# Patient Record
Sex: Female | Born: 1990 | Race: White | Hispanic: No | Marital: Single | Smoking: Former smoker
Health system: Southern US, Community
[De-identification: ages and names within clinical notes are randomized; demographics above are authoritative.]

## PROBLEM LIST (undated history)

## (undated) DIAGNOSIS — T7840XA Allergy, unspecified, initial encounter: Secondary | ICD-10-CM

## (undated) HISTORY — PX: TYMPANOSTOMY TUBE PLACEMENT: SHX32

## (undated) HISTORY — DX: Allergy, unspecified, initial encounter: T78.40XA

---

## 2013-05-26 ENCOUNTER — Ambulatory Visit (INDEPENDENT_AMBULATORY_CARE_PROVIDER_SITE_OTHER): Payer: BC Managed Care – PPO | Admitting: Family Medicine

## 2013-05-26 ENCOUNTER — Ambulatory Visit: Payer: BC Managed Care – PPO

## 2013-05-26 VITALS — BP 134/72 | HR 71 | Temp 98.4°F | Resp 20 | Ht 65.0 in | Wt 204.0 lb

## 2013-05-26 DIAGNOSIS — R059 Cough, unspecified: Secondary | ICD-10-CM

## 2013-05-26 DIAGNOSIS — R05 Cough: Secondary | ICD-10-CM

## 2013-05-26 DIAGNOSIS — R062 Wheezing: Secondary | ICD-10-CM

## 2013-05-26 LAB — POCT CBC
Granulocyte percent: 69.4 %G (ref 37–80)
HCT, POC: 48.1 % — AB (ref 37.7–47.9)
Hemoglobin: 15.4 g/dL (ref 12.2–16.2)
Lymph, poc: 2.9 (ref 0.6–3.4)
MCH, POC: 29.5 pg (ref 27–31.2)
MCHC: 32 g/dL (ref 31.8–35.4)
MCV: 92.2 fL (ref 80–97)
MID (cbc): 0.7 (ref 0–0.9)
MPV: 11.4 fL (ref 0–99.8)
POC Granulocyte: 8.2 — AB (ref 2–6.9)
POC LYMPH PERCENT: 24.9 %L (ref 10–50)
POC MID %: 5.7 %M (ref 0–12)
Platelet Count, POC: 316 10*3/uL (ref 142–424)
RBC: 5.22 M/uL (ref 4.04–5.48)
RDW, POC: 14.2 %
WBC: 11.8 10*3/uL — AB (ref 4.6–10.2)

## 2013-05-26 MED ORDER — ALBUTEROL SULFATE (2.5 MG/3ML) 0.083% IN NEBU
2.5000 mg | INHALATION_SOLUTION | Freq: Once | RESPIRATORY_TRACT | Status: AC
Start: 2013-05-26 — End: 2013-05-26
  Administered 2013-05-26: 2.5 mg via RESPIRATORY_TRACT

## 2013-05-26 MED ORDER — IPRATROPIUM BROMIDE 0.02 % IN SOLN
0.5000 mg | Freq: Once | RESPIRATORY_TRACT | Status: AC
  Administered 2013-05-26: 0.5 mg via RESPIRATORY_TRACT

## 2013-05-26 MED ORDER — ALBUTEROL SULFATE HFA 108 (90 BASE) MCG/ACT IN AERS: 2.0000 | INHALATION_SPRAY | RESPIRATORY_TRACT | Status: DC | PRN

## 2013-05-26 MED ORDER — METHYLPREDNISOLONE (PAK) 4 MG PO TABS: ORAL_TABLET | ORAL | Status: DC

## 2013-05-26 MED ORDER — AZITHROMYCIN 250 MG PO TABS: ORAL_TABLET | ORAL | Status: DC

## 2013-05-26 NOTE — Progress Notes (Signed)
  Subjective:    Patient ID: Amy Winters, female    DOB: 1991/03/17, 22 y.o.   MRN: [id number]  HPI 22 year old female presents for evaluation of cough x 3 weeks. States cough is productive of yellow/green sputum.  Denies fever, chills, nasal congestion, sore throat, otalgia, nausea, vomiting, dizziness, hemoptysis, or headache. Does have SOB and complains of "tight" feeling in chest.  She has not taken any OTC medications for this but does have hx of seasonal allergies for which she takes loratadine fairly regularly.  No hx of asthma.  Patient is otherwise healthy with no other concerns today.     Review of Systems  Constitutional: Negative for fever and chills.  HENT: Negative for congestion, postnasal drip, rhinorrhea, sinus pressure and sore throat.   Respiratory: Positive for cough, chest tightness and wheezing.   Cardiovascular: Negative for chest pain.  Neurological: Negative for dizziness and headaches.       Objective:   Physical Exam  Constitutional: She is oriented to person, place, and time. She appears well-developed and well-nourished.  HENT:  Head: Normocephalic and atraumatic.  Right Ear: Hearing, tympanic membrane, external ear and ear canal normal.  Left Ear: Hearing, tympanic membrane, external ear and ear canal normal.  Mouth/Throat: Uvula is midline, oropharynx is clear and moist and mucous membranes are normal. No oropharyngeal exudate.  Eyes: Conjunctivae are normal.  Neck: Normal range of motion. Neck supple.  Cardiovascular: Normal rate, regular rhythm and normal heart sounds.   Pulmonary/Chest: Effort normal. She has wheezes. She has no rales.  Lymphadenopathy:    She has no cervical adenopathy.  Neurological: She is alert and oriented to person, place, and time.  Psychiatric: She has a normal mood and affect. Her behavior is normal. Judgment and thought content normal.     Pre-nebulizer peak flow 250. Post-nebulizer peak flow 350. Patient reports  significant improvement in symptoms after nebulizer [facility] reading (PRIMARY) by  Dr. Katrinka Blazing as no acute infiltrate or consolidation.      Assessment & Plan:  Cough - Plan: DG Chest 2 View, POCT CBC  Wheezing - Plan: albuterol (PROVENTIL) (2.5 MG/3ML) 0.083% nebulizer solution 2.5 mg, ipratropium (ATROVENT) nebulizer solution 0.5 mg  Start Zpack tonight and medrol dose pack tomorrow morning Albuterol inhaler q4-6hours prn wheezing.  Recommend mucinex OTC as directed Follow up if symptoms worsen or fail to improve.

## 2014-02-17 ENCOUNTER — Ambulatory Visit (INDEPENDENT_AMBULATORY_CARE_PROVIDER_SITE_OTHER): Payer: BC Managed Care – PPO | Admitting: Family Medicine

## 2014-02-17 VITALS — BP 118/68 | HR 62 | Temp 97.6°F | Resp 16 | Ht 64.0 in | Wt 213.0 lb

## 2014-02-17 DIAGNOSIS — J0110 Acute frontal sinusitis, unspecified: Secondary | ICD-10-CM

## 2014-02-17 DIAGNOSIS — R059 Cough, unspecified: Secondary | ICD-10-CM

## 2014-02-17 DIAGNOSIS — R05 Cough: Secondary | ICD-10-CM

## 2014-02-17 DIAGNOSIS — J0111 Acute recurrent frontal sinusitis: Secondary | ICD-10-CM

## 2014-02-17 MED ORDER — AZITHROMYCIN 250 MG PO TABS: ORAL_TABLET | ORAL | Status: DC

## 2014-02-17 NOTE — Progress Notes (Signed)
[facility] [address], [place] [id number] [phone]  Date:  02/17/2014   Name:  Amy Winters   DOB:  07/03/1991   MRN:  [id number]  PCP:  No PCP Per Patient    Chief Complaint: Cough and Ear Fullness   History of Present Illness:  Amy Winters is a 23 y.o. very pleasant female patient who presents with the following:  She is here today with illness for about one week. She has noted congestion in her sinuses, right ear fullness.  Notes that she tends to get ear wax and wonders if this could be the case now.   She has noted a mild cough- "tickly throat." She has not noted a fever, she has noted some chills and aches as well a few days ago.  She slept a lot on Saturday.  No GI symptoms.   [place] is not SA so there is no risk of pregnancy and she is generally irregulr  There are no active problems to display for this patient.   Past Medical History  Diagnosis Date  . Allergy     History reviewed. No pertinent past surgical history.  History  Substance Use Topics  . Smoking status: Former Games developermoker  . Smokeless tobacco: Not on file  . Alcohol Use: Yes    Family History  Problem Relation Age of Onset  . Diabetes Paternal Grandmother   . Stroke Paternal Grandmother     Allergies  Allergen Reactions  . Amoxicillin Nausea And Vomiting    Medication list has been reviewed and updated.  Current Outpatient Prescriptions on File Prior to Visit  Medication Sig Dispense Refill  . Loratadine-Pseudoephedrine (PX ALLERGY RELIEF D, LORATID, PO) Take by mouth.      Marland Kitchen. albuterol (PROVENTIL HFA;VENTOLIN HFA) 108 (90 BASE) MCG/ACT inhaler Inhale 2 puffs into the lungs every 4 (four) hours as needed for wheezing or shortness of breath (cough, shortness of breath or wheezing.).  1 Inhaler  0  . azithromycin (ZITHROMAX) 250 MG tablet Take 2 tabs PO x 1 dose, then 1 tab PO QD x 4 days  6 tablet  0  . methylPREDNIsolone (MEDROL DOSPACK) 4 MG  tablet follow package directions  21 tablet  0   No current facility-administered medications on file prior to visit.    Review of Systems:  As per HPI- otherwise negative.   Physical Examination: Filed Vitals:   02/17/14 1218  BP: 118/68  Pulse: 62  Temp: 97.6 F (36.4 C)  Resp: 16   Filed Vitals:   02/17/14 1218  Height: 5\' 4"  (1.626 m)  Weight: 213 lb (96.616 kg)   Body mass index is 36.54 kg/(m^2). Ideal Body Weight: Weight in (lb) to have BMI = 25: 145.3  GEN: WDWN, NAD, Non-toxic, A & O x 3, overweight, looks well HEENT: Atraumatic, Normocephalic. Neck supple. No masses, No LAD.  Bilateral TM wnl, oropharynx normal.  PEERL,EOMI.  Right ear obscured by wax.  This was removed with curette and warm water irrigation- then normal exam Ears and Nose: No external deformity. CV: RRR, No M/G/R. No JVD. No thrill. No extra heart sounds. PULM: CTA B, no wheezes, crackles, rhonchi. No retractions. No resp. distress. No accessory muscle use EXTR: No c/c/e NEURO Normal gait.  PSYCH: Normally interactive. Conversant. Not depressed or anxious appearing.  Calm demeanor.    Assessment and Plan: Acute recurrent frontal sinusitis - Plan: azithromycin (ZITHROMAX) 250 MG tablet  Cough -  Plan: azithromycin (ZITHROMAX) 250 MG tablet  Treat for sinusitis with azithromycin. She will continue to use oTC medications as needed, and will let me know if not better soon   Signed Abbe Amsterdam, MD

## 2014-02-17 NOTE — Patient Instructions (Signed)
Use the azithromycin as directed.  Let me know if you are not getting better in the next few days Sooner if worse.

## 2014-02-24 ENCOUNTER — Ambulatory Visit (INDEPENDENT_AMBULATORY_CARE_PROVIDER_SITE_OTHER): Payer: BC Managed Care – PPO | Admitting: Family Medicine

## 2014-02-24 VITALS — BP 112/76 | HR 69 | Temp 98.0°F | Resp 12 | Ht 64.5 in | Wt 213.0 lb

## 2014-02-24 DIAGNOSIS — J0181 Other acute recurrent sinusitis: Secondary | ICD-10-CM

## 2014-02-24 DIAGNOSIS — J019 Acute sinusitis, unspecified: Secondary | ICD-10-CM

## 2014-02-24 MED ORDER — FLUTICASONE PROPIONATE 50 MCG/ACT NA SUSP: NASAL | Status: AC

## 2014-02-24 MED ORDER — CEFDINIR 300 MG PO CAPS: 600.0000 mg | ORAL_CAPSULE | Freq: Every day | ORAL | Status: AC

## 2014-02-24 NOTE — Progress Notes (Signed)
Subjective: 23 year old lady who was treated last week for sinusitis. She is going to [place] this weekend and didn't think she was clearing up completely enough. She has done better, but still has a lot of postnasal drainage and some otalgia. No fever. Does not smoke.  Objective: TMs are normal. Throat has 2 erythematous streaks going down either side. Neck supple without significant nodes. No major tenderness of the sinuses. Chest clear. Heart regular without murmurs.  Assessment: Recurrent or incompletely treated sinusitis  Plan: Omnicef 300 twice a day Fluticasone spray Return when necessary

## 2015-02-20 IMAGING — CR DG CHEST 2V
2 series · 2 of 2 positions shown · non-contrast
Comparison: None.

CLINICAL DATA: Cough

EXAM:
CHEST  2 VIEW

[PA]
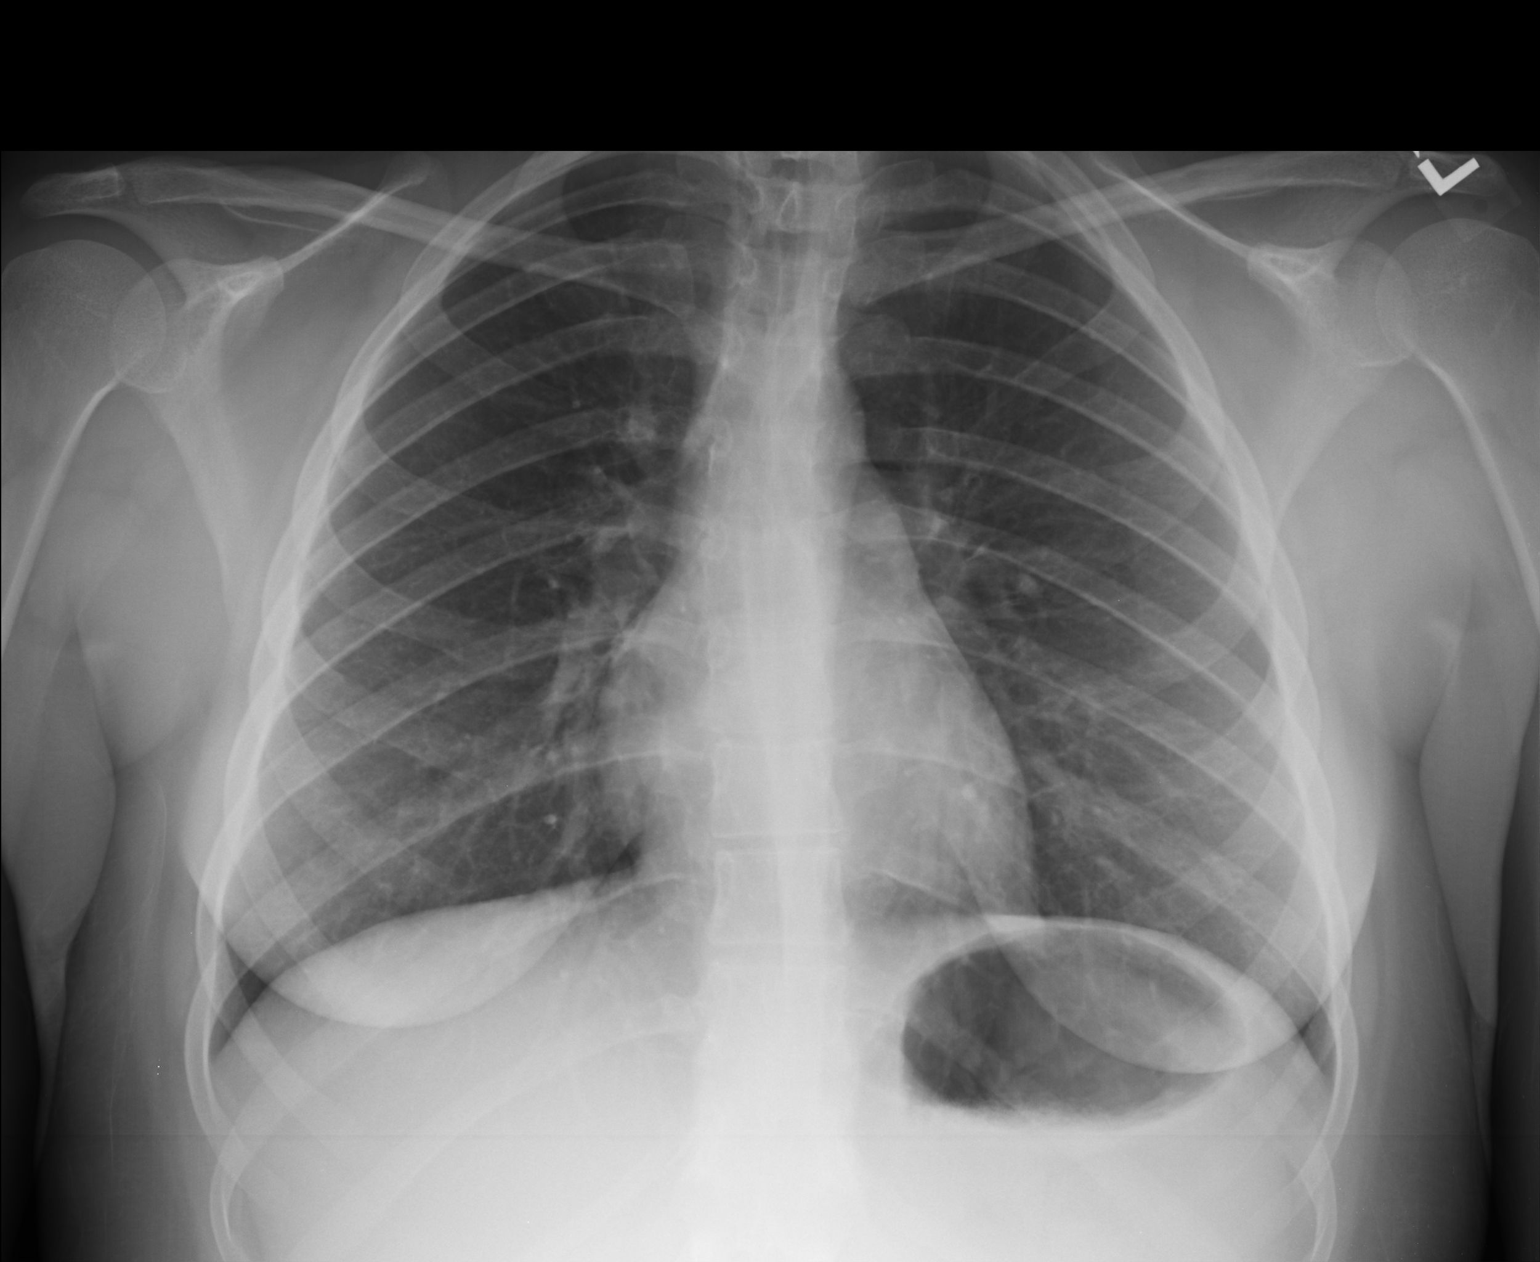

[lateral]
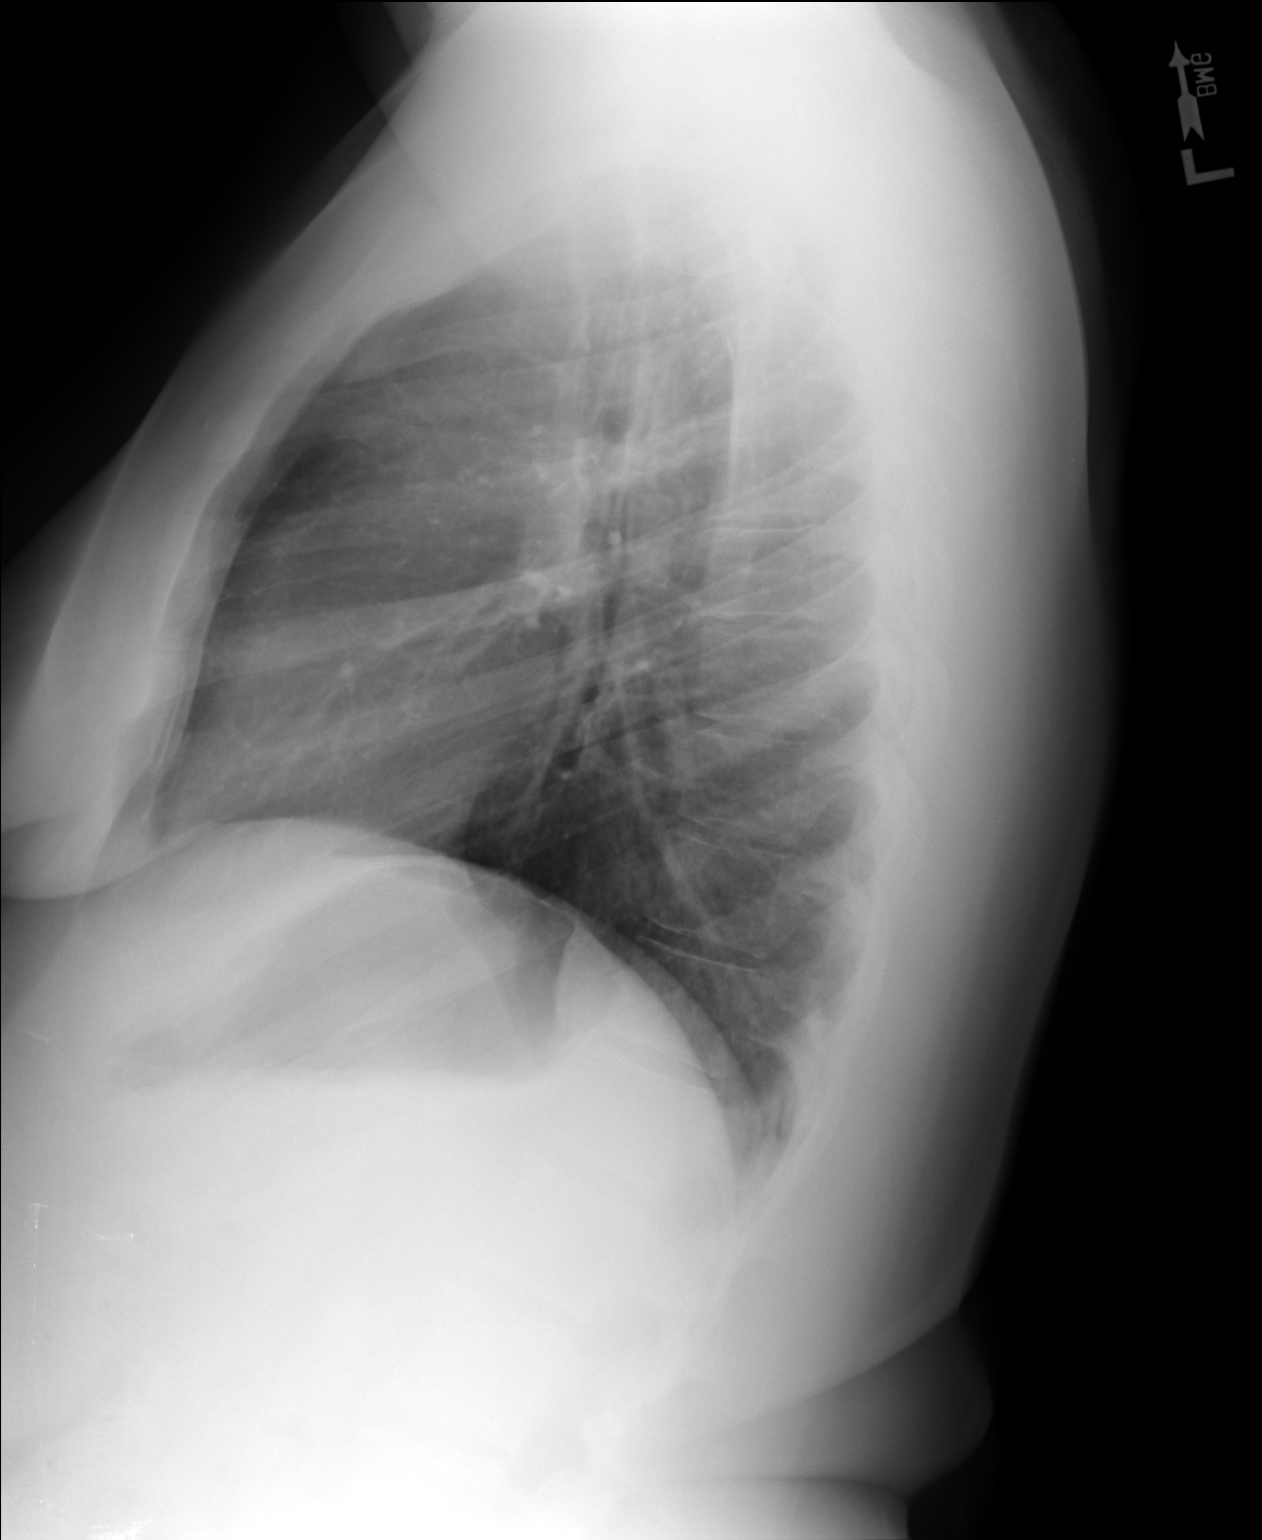

[2 of 2 positions shown; findings below may reference images not displayed]

FINDINGS: The heart size and mediastinal contours are within normal limits.
Both lungs are clear. The visualized skeletal structures are
unremarkable.
IMPRESSION: No active cardiopulmonary disease.
